# Patient Record
Sex: Male | Born: 1975 | Race: Black or African American | Hispanic: No | Marital: Married | Smoking: Current every day smoker
Health system: Southern US, Community
[De-identification: ages and names within clinical notes are randomized; demographics above are authoritative.]

## PROBLEM LIST (undated history)

## (undated) DIAGNOSIS — I10 Essential (primary) hypertension: Secondary | ICD-10-CM

## (undated) HISTORY — PX: KNEE SURGERY: SHX244

---

## 1998-11-29 ENCOUNTER — Emergency Department (HOSPITAL_COMMUNITY): Admission: EM | Admit: 1998-11-29 | Discharge: 1998-11-29 | Payer: Self-pay

## 1998-12-07 ENCOUNTER — Emergency Department (HOSPITAL_COMMUNITY): Admission: EM | Admit: 1998-12-07 | Discharge: 1998-12-07 | Payer: Self-pay | Admitting: Emergency Medicine

## 2008-10-30 ENCOUNTER — Emergency Department (HOSPITAL_COMMUNITY): Admission: EM | Admit: 2008-10-30 | Discharge: 2008-10-30 | Payer: Self-pay | Admitting: Emergency Medicine

## 2008-11-15 ENCOUNTER — Emergency Department (HOSPITAL_COMMUNITY): Admission: EM | Admit: 2008-11-15 | Discharge: 2008-11-15 | Payer: Self-pay | Admitting: Emergency Medicine

## 2010-07-21 LAB — URINALYSIS, MICROSCOPIC ONLY
Bilirubin Urine: NEGATIVE
Glucose, UA: NEGATIVE mg/dL
Nitrite: NEGATIVE
Protein, ur: 30 mg/dL — AB
Specific Gravity, Urine: 1.029 (ref 1.005–1.030)
Urobilinogen, UA: 0.2 mg/dL (ref 0.0–1.0)
pH: 7 (ref 5.0–8.0)

## 2010-07-21 LAB — BASIC METABOLIC PANEL
BUN: 11 mg/dL (ref 6–23)
CO2: 24 mEq/L (ref 19–32)
Calcium: 9.1 mg/dL (ref 8.4–10.5)
Chloride: 104 mEq/L (ref 96–112)
Creatinine, Ser: 1.37 mg/dL (ref 0.4–1.5)
GFR calc Af Amer: >60 mL/min (ref >60)
GFR calc non Af Amer: >60 mL/min (ref >60)
Glucose, Bld: 98 mg/dL (ref 70–99)
Potassium: 3.5 mEq/L (ref 3.5–5.1)
Sodium: 137 mEq/L (ref 135–145)

## 2010-10-29 ENCOUNTER — Emergency Department (HOSPITAL_COMMUNITY)
Admission: EM | Admit: 2010-10-29 | Discharge: 2010-10-29 | Disposition: A | Discharge status: Home / Self Care | Payer: Self-pay | Attending: Emergency Medicine | Admitting: Emergency Medicine

## 2010-10-29 DIAGNOSIS — N390 Urinary tract infection, site not specified: Secondary | ICD-10-CM | POA: Insufficient documentation

## 2010-10-29 DIAGNOSIS — E860 Dehydration: Secondary | ICD-10-CM | POA: Insufficient documentation

## 2010-10-29 DIAGNOSIS — R10819 Abdominal tenderness, unspecified site: Secondary | ICD-10-CM | POA: Insufficient documentation

## 2010-10-29 DIAGNOSIS — R197 Diarrhea, unspecified: Secondary | ICD-10-CM | POA: Insufficient documentation

## 2010-10-29 LAB — DIFFERENTIAL
Lymphs Abs: 1.4 10*3/uL (ref 0.7–4.0)
Monocytes Relative: 8 % (ref 3–12)
Neutro Abs: 6.6 10*3/uL (ref 1.7–7.7)
Neutrophils Relative %: 75 % (ref 43–77)

## 2010-10-29 LAB — URINE MICROSCOPIC-ADD ON

## 2010-10-29 LAB — COMPREHENSIVE METABOLIC PANEL
ALT: 15 U/L (ref 0–53)
CO2: 27 mEq/L (ref 19–32)
Calcium: 10 mg/dL (ref 8.4–10.5)
Creatinine, Ser: 1.42 mg/dL — ABNORMAL HIGH (ref 0.50–1.35)
GFR calc Af Amer: >60 mL/min (ref >60)
GFR calc non Af Amer: 57 mL/min — ABNORMAL LOW (ref >60)
Glucose, Bld: 91 mg/dL (ref 70–99)

## 2010-10-29 LAB — URINALYSIS, ROUTINE W REFLEX MICROSCOPIC
Glucose, UA: NEGATIVE mg/dL
Hgb urine dipstick: NEGATIVE
Specific Gravity, Urine: 1.036 — ABNORMAL HIGH (ref 1.005–1.030)
Urobilinogen, UA: 1 mg/dL (ref 0.0–1.0)

## 2010-10-29 LAB — PROTIME-INR
INR: 1.05 (ref 0.00–1.49)
Prothrombin Time: 13.9 seconds (ref 11.6–15.2)

## 2010-10-29 LAB — CBC
Hemoglobin: 16.1 g/dL (ref 13.0–17.0)
MCH: 31.8 pg (ref 26.0–34.0)
MCV: 90.1 fL (ref 78.0–100.0)
RBC: 5.07 MIL/uL (ref 4.22–5.81)

## 2010-10-31 LAB — URINE CULTURE: Colony Count: 45000

## 2011-09-27 ENCOUNTER — Emergency Department (HOSPITAL_COMMUNITY): Payer: Self-pay

## 2011-09-27 ENCOUNTER — Encounter (HOSPITAL_COMMUNITY): Payer: Self-pay | Admitting: Emergency Medicine

## 2011-09-27 DIAGNOSIS — N200 Calculus of kidney: Secondary | ICD-10-CM | POA: Insufficient documentation

## 2011-09-27 DIAGNOSIS — S31809A Unspecified open wound of unspecified buttock, initial encounter: Secondary | ICD-10-CM | POA: Insufficient documentation

## 2011-09-27 DIAGNOSIS — IMO0002 Reserved for concepts with insufficient information to code with codable children: Secondary | ICD-10-CM | POA: Insufficient documentation

## 2011-09-27 DIAGNOSIS — S62309A Unspecified fracture of unspecified metacarpal bone, initial encounter for closed fracture: Secondary | ICD-10-CM | POA: Insufficient documentation
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
## 2011-09-28 ENCOUNTER — Emergency Department (HOSPITAL_COMMUNITY): Payer: Self-pay

## 2011-09-28 ENCOUNTER — Emergency Department (HOSPITAL_COMMUNITY)
Admission: EM | Admit: 2011-09-28 | Discharge: 2011-09-28 | Disposition: A | Discharge status: Home / Self Care | Payer: Self-pay | Attending: Emergency Medicine | Admitting: Emergency Medicine

## 2011-09-28 DIAGNOSIS — S62308A Unspecified fracture of other metacarpal bone, initial encounter for closed fracture: Secondary | ICD-10-CM

## 2011-09-28 DIAGNOSIS — S8392XA Sprain of unspecified site of left knee, initial encounter: Secondary | ICD-10-CM

## 2011-09-28 DIAGNOSIS — S31801A Laceration without foreign body of unspecified buttock, initial encounter: Secondary | ICD-10-CM

## 2011-09-28 LAB — CBC
HCT: 47.8 % (ref 39.0–52.0)
Hemoglobin: 16.7 g/dL (ref 13.0–17.0)
MCH: 32.4 pg (ref 26.0–34.0)
MCHC: 34.9 g/dL (ref 30.0–36.0)
MCV: 92.6 fL (ref 78.0–100.0)

## 2011-09-28 LAB — DIFFERENTIAL
Basophils Relative: 0 % (ref 0–1)
Eosinophils Absolute: 0.3 10*3/uL (ref 0.0–0.7)
Eosinophils Relative: 2 % (ref 0–5)
Lymphs Abs: 2.3 10*3/uL (ref 0.7–4.0)
Monocytes Absolute: 0.8 10*3/uL (ref 0.1–1.0)
Monocytes Relative: 6 % (ref 3–12)

## 2011-09-28 LAB — BASIC METABOLIC PANEL
BUN: 10 mg/dL (ref 6–23)
Creatinine, Ser: 1.15 mg/dL (ref 0.50–1.35)
GFR calc Af Amer: >90 mL/min (ref >90)
GFR calc non Af Amer: 82 mL/min — ABNORMAL LOW (ref >90)
Glucose, Bld: 74 mg/dL (ref 70–99)

## 2011-09-28 MED ORDER — IOHEXOL 300 MG/ML  SOLN
100.0000 mL | Freq: Once | INTRAMUSCULAR | Status: AC | PRN
  Administered 2011-09-28: 100 mL via INTRAVENOUS

## 2011-09-28 MED ORDER — OXYCODONE-ACETAMINOPHEN 5-325 MG PO TABS: 1.0000 | ORAL_TABLET | ORAL | Status: DC | PRN

## 2011-09-28 MED ORDER — CEPHALEXIN 500 MG PO CAPS: 500.0000 mg | ORAL_CAPSULE | Freq: Four times a day (QID) | ORAL | Status: DC

## 2011-09-28 MED ORDER — FENTANYL CITRATE 0.05 MG/ML IJ SOLN
50.0000 ug | Freq: Once | INTRAMUSCULAR | Status: AC
  Administered 2011-09-28: 50 ug via INTRAVENOUS
  Filled 2011-09-28: qty 2

## 2011-09-28 MED ORDER — IBUPROFEN 600 MG PO TABS: 600.0000 mg | ORAL_TABLET | Freq: Four times a day (QID) | ORAL | Status: DC | PRN

## 2011-09-28 NOTE — ED Notes (Signed)
[employer] aware of pt's change in status,  Farley Ly charge aware and also noted
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
## 2011-09-28 NOTE — ED Notes (Signed)
Labs drawn by RN 

## 2011-09-28 NOTE — ED Provider Notes (Signed)
Medical screening examination/treatment/procedure(s) were performed by non-physician practitioner and as supervising physician I was immediately available for consultation/collaboration.   Ming Kunka, MD 09/28/11 0634 
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
## 2011-10-08 ENCOUNTER — Telehealth: Payer: Self-pay | Admitting: Orthopedic Surgery

## 2011-10-08 NOTE — Telephone Encounter (Signed)
Patient called for clinic appt. I had sent a message about a week ago to the PA who placed his staples recommending they see him back to take those out as we would have to charge him a new patient visit in order to do that. In any event, I cannot get him into clinic until 7/11, which is far too long for the staples to stay in. I left a message suggesting he call the ED and request they bring him in to remove his staples as that should be covered under his global period. He may call back if he has further questions.

## 2011-10-09 ENCOUNTER — Emergency Department (HOSPITAL_COMMUNITY)
Admission: EM | Admit: 2011-10-09 | Discharge: 2011-10-09 | Disposition: A | Discharge status: Home / Self Care | Payer: Self-pay | Attending: Emergency Medicine | Admitting: Emergency Medicine

## 2011-10-09 ENCOUNTER — Encounter (HOSPITAL_COMMUNITY): Payer: Self-pay | Admitting: Emergency Medicine

## 2011-10-09 DIAGNOSIS — Z4802 Encounter for removal of sutures: Secondary | ICD-10-CM | POA: Insufficient documentation

## 2011-10-09 NOTE — ED Provider Notes (Signed)
Medical screening examination/treatment/procedure(s) were performed by non-physician practitioner and as supervising physician I was immediately available for consultation/collaboration.   Kionte Baumgardner, MD 10/09/11 1557 
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
## 2011-10-09 NOTE — Discharge Instructions (Signed)
Staple Removal  Care After  The staples used to close your skin have been removed. The wound needs continued care so it can heal completely and without problems. The care described here will need to be done for another 5-10 days unless your caregiver advises otherwise.   HOME CARE INSTRUCTIONS    Keep wound site dry and clean.   If skin adhesive strips were applied after the staples were removed, they will begin to peel off in a few days. If they remain after fourteen days, they may be peeled off and discarded.   If you still have a dressing, change it at least once a day or as instructed by your caregiver. If the bandage sticks, soak it off with warm water. Pat dry with a clean towel. Look for signs of infection (see below).   Reapply cream or ointment according to your caregiver's instruction. This will help prevent infection and keep the bandage from sticking. Use of a non-stick material over the wound and under the dressing or wrap will also help keep the bandage from sticking.   If the bandage becomes wet, dirty or develops a foul smell, change it as soon as possible.   New scars become sunburned easily. Use sunscreens with protection factor (SPF) of at least 15 when out in the sun.   Only take over-the-counter or prescription medicines for pain, discomfort or fever as directed by your caregiver.  SEEK IMMEDIATE MEDICAL CARE IF:    There is redness, swelling or increasing pain in the wound.   Pus is coming from the wound.   An unexplained oral temperature above 102 F (38.9 C) develops.   You notice a foul smell coming from the wound or dressing.   There is a breaking open of the suture line (edges not staying together) of the wound edges after staples have been removed.  Document Released: 03/13/2008 Document Revised: 03/20/2011 Document Reviewed: 03/13/2008  [employer] Patient Information 2012 [employer].

## 2013-05-08 ENCOUNTER — Emergency Department (HOSPITAL_COMMUNITY)
Admission: EM | Admit: 2013-05-08 | Discharge: 2013-05-08 | Disposition: A | Discharge status: Home / Self Care | Payer: BC Managed Care – PPO | Attending: Emergency Medicine | Admitting: Emergency Medicine

## 2013-05-08 ENCOUNTER — Encounter (HOSPITAL_COMMUNITY): Payer: Self-pay | Admitting: Emergency Medicine

## 2013-05-08 DIAGNOSIS — IMO0002 Reserved for concepts with insufficient information to code with codable children: Secondary | ICD-10-CM | POA: Insufficient documentation

## 2013-05-08 DIAGNOSIS — M25462 Effusion, left knee: Secondary | ICD-10-CM

## 2013-05-08 DIAGNOSIS — X500XXA Overexertion from strenuous movement or load, initial encounter: Secondary | ICD-10-CM | POA: Insufficient documentation

## 2013-05-08 DIAGNOSIS — F17200 Nicotine dependence, unspecified, uncomplicated: Secondary | ICD-10-CM | POA: Insufficient documentation

## 2013-05-08 DIAGNOSIS — Y9389 Activity, other specified: Secondary | ICD-10-CM | POA: Insufficient documentation

## 2013-05-08 DIAGNOSIS — Y9289 Other specified places as the place of occurrence of the external cause: Secondary | ICD-10-CM | POA: Insufficient documentation

## 2013-05-08 DIAGNOSIS — S8390XA Sprain of unspecified site of unspecified knee, initial encounter: Secondary | ICD-10-CM

## 2013-05-08 MED ORDER — HYDROCODONE-ACETAMINOPHEN 5-325 MG PO TABS: 1.0000 | ORAL_TABLET | ORAL | Status: AC | PRN

## 2013-05-08 MED ORDER — IBUPROFEN 800 MG PO TABS
800.0000 mg | ORAL_TABLET | Freq: Once | ORAL | Status: AC
  Administered 2013-05-08: 800 mg via ORAL
  Filled 2013-05-08: qty 1

## 2013-05-08 MED ORDER — IBUPROFEN 800 MG PO TABS: 800.0000 mg | ORAL_TABLET | Freq: Three times a day (TID) | ORAL | Status: AC

## 2013-05-08 NOTE — ED Provider Notes (Signed)
CSN: [id number]     Arrival date & time 05/08/13  1910 History  This chart was scribed for non-physician practitioner Ivonne AndrewPeter Kiron Osmun, working with Junius ArgyleForrest S Harrison, MD by Carl Bestelina Holson, ED Scribe. This patient was seen in room WTR8/WTR8 and the patient's care was started at 8:50 PM.    Chief Complaint  Patient presents with  . Knee Injury    The history is provided by the patient. No language interpreter was used.   HPI Comments: Dale Velazquez is a 38 y.o. male who presents to the Emergency Department complaining of constant left knee pain that started yesterday after the patient was lifting furniture off of a truck and twisted his knee the wrong way.  He states that his knee went out toward the left.  He states that he can still walk using a cane.  He states that he has been using an ACE bandage to support his left knee.  He denies weakness or numbness in his left knee, fever, or chills as associated symptoms.  He denies using any medication to treat his pain.  He denies having a history of ligament repair in his left knee.  He states that he has a history of an ACL tear in his left knee.  He denies having any allergies to medication.  He denies having any other medical problems.     History reviewed. No pertinent past medical history. History reviewed. No pertinent past surgical history. No family history on file. History  Substance Use Topics  . Smoking status: Current Every Day Smoker -- 1.00 packs/day    Types: Cigarettes  . Smokeless tobacco: Not on file  . Alcohol Use: Yes    Review of Systems  Constitutional: Negative for fever and chills.  Musculoskeletal: Positive for arthralgias (left knee).  Neurological: Negative for numbness.  All other systems reviewed and are negative.    Allergies  Review of patient's allergies indicates no known allergies.  Home Medications  No current outpatient prescriptions on file.  Triage Vitals: BP 168/111  Pulse 80  Temp(Src) 99.1  F (37.3 C) (Oral)  Resp 20  Ht 5\' 9"  (1.753 m)  Wt 215 lb (97.523 kg)  BMI 31.74 kg/m2  SpO2 99%  Physical Exam  Nursing note and vitals reviewed. Constitutional: He is oriented to person, place, and time. He appears well-developed and well-nourished. No distress.  HENT:  Head: Normocephalic and atraumatic.  Eyes: EOM are normal.  Neck: Neck supple. No tracheal deviation present.  Cardiovascular: Normal rate.   Pulmonary/Chest: Effort normal. No respiratory distress.  Musculoskeletal: Normal range of motion.  Exam is somewhat limited due to patient comfort. Swelling and join effusion in the left knee.  Skin on the left knee is warm without erythema.    Neurological: He is alert and oriented to person, place, and time.  Skin: Skin is warm and dry.  Psychiatric: He has a normal mood and affect. His behavior is normal.    ED Course  Procedures   DIAGNOSTIC STUDIES: Oxygen Saturation is 99% on room air, normal by my interpretation.    COORDINATION OF CARE: 8:53 PM- Advised the patient to apply ice to, wrap, and elevate his left knee. Discussed options for x-rays of the knee with patient. He has refused and prefers to followup with an orthopedic specialist. There was no history of significant trauma. Advised the patient to follow-up with an orthopedic doctor.  Advised the patient to take Ibuprofen or Aleve everyday every 12 hours.  Discussed  discharging the patient with crutches and a prescription for Ibuprofen.  The patient agreed to the treatment plan.     MDM   1. Knee effusion, left   2. Knee sprain       I personally performed the services described in this documentation, which was scribed in my presence. The recorded information has been reviewed and is accurate.   Angus Seller, PA-C 05/08/13 2251

## 2013-05-08 NOTE — ED Notes (Signed)
Will need a note for work. Not a work related injury

## 2013-05-08 NOTE — ED Notes (Signed)
Pt presents with NAD- Pt moving furniture and twisted left knee yesterday. Pt c/o of swelling and pain. Pt has tried nothing OTC for pain. ACE bandage intact Good CMS

## 2013-05-08 NOTE — ED Provider Notes (Signed)
Medical screening examination/treatment/procedure(s) were performed by non-physician practitioner and as supervising physician I was immediately available for consultation/collaboration.  EKG Interpretation   None         Junius ArgyleForrest S Deriana Vanderhoef, MD 05/08/13 2329

## 2013-05-08 NOTE — Discharge Instructions (Signed)
Please followup with a primary care provider or orthopedics specialist for continued evaluation and treatment of your knee pain and injury. Use rest, ice, compression and elevation to reduce pain and swelling.    Knee Effusion The medical term for having fluid in your knee is effusion. This is often due to an internal derangement of the knee. This means something is wrong inside the knee. Some of the causes of fluid in the knee may be torn cartilage, a torn ligament, or bleeding into the joint from an injury. Your knee is likely more difficult to bend and move. This is often because there is increased pain and pressure in the joint. The time it takes for recovery from a knee effusion depends on different factors, including:   Type of injury.  Your age.  Physical and medical conditions.  Rehabilitation Strategies. How long you will be away from your normal activities will depend on what kind of knee problem you have and how much damage is present. Your knee has two types of cartilage. Articular cartilage covers the bone ends and lets your knee bend and move smoothly. Two menisci, thick pads of cartilage that form a rim inside the joint, help absorb shock and stabilize your knee. Ligaments bind the bones together and support your knee joint. Muscles move the joint, help support your knee, and take stress off the joint itself. CAUSES  Often an effusion in the knee is caused by an injury to one of the menisci. This is often a tear in the cartilage. Recovery after a meniscus injury depends on how much meniscus is damaged and whether you have damaged other knee tissue. Small tears may heal on their own with conservative treatment. Conservative means rest, limited weight bearing activity and muscle strengthening exercises. Your recovery may take up to 6 weeks.  TREATMENT  Larger tears may require surgery. Meniscus injuries may be treated during arthroscopy. Arthroscopy is a procedure in which your surgeon  uses a small telescope like instrument to look in your knee. Your caregiver can make a more accurate diagnosis (learning what is wrong) by performing an arthroscopic procedure. If your injury is on the inner margin of the meniscus, your surgeon may trim the meniscus back to a smooth rim. In other cases your surgeon will try to repair a damaged meniscus with stitches (sutures). This may make rehabilitation take longer, but may provide better long term result by helping your knee keep its shock absorption capabilities. Ligaments which are completely torn usually require surgery for repair. HOME CARE INSTRUCTIONS  Use crutches as instructed.  If a brace is applied, use as directed.  Once you are home, an ice pack applied to your swollen knee may help with discomfort and help decrease swelling.  Keep your knee raised (elevated) when you are not up and around or on crutches.  Only take over-the-counter or prescription medicines for pain, discomfort, or fever as directed by your caregiver.  Your caregivers will help with instructions for rehabilitation of your knee. This often includes strengthening exercises.  You may resume a normal diet and activities as directed. SEEK MEDICAL CARE IF:   There is increased swelling in your knee.  You notice redness, swelling, or increasing pain in your knee.  An unexplained oral temperature above 102 F (38.9 C) develops. SEEK IMMEDIATE MEDICAL CARE IF:   You develop a rash.  You have difficulty breathing.  You have any allergic reactions from medications you may have been given.  There is severe  pain with any motion of the knee. MAKE SURE YOU:   Understand these instructions.  Will watch your condition.  Will get help right away if you are not doing well or get worse. Document Released: 06/21/2003 Document Revised: 06/23/2011 Document Reviewed: 08/25/2007 [facility] Patient Information 2014 [place]Care, [place].

## 2014-04-07 ENCOUNTER — Emergency Department (HOSPITAL_COMMUNITY)
Admission: EM | Admit: 2014-04-07 | Discharge: 2014-04-07 | Disposition: A | Discharge status: Home / Self Care | Payer: BC Managed Care – PPO | Attending: Emergency Medicine | Admitting: Emergency Medicine

## 2014-04-07 ENCOUNTER — Encounter (HOSPITAL_COMMUNITY): Payer: Self-pay

## 2014-04-07 ENCOUNTER — Emergency Department (HOSPITAL_COMMUNITY): Payer: BC Managed Care – PPO

## 2014-04-07 DIAGNOSIS — S2231XA Fracture of one rib, right side, initial encounter for closed fracture: Secondary | ICD-10-CM

## 2014-04-07 DIAGNOSIS — S028XXA Fractures of other specified skull and facial bones, initial encounter for closed fracture: Secondary | ICD-10-CM | POA: Insufficient documentation

## 2014-04-07 DIAGNOSIS — Z720 Tobacco use: Secondary | ICD-10-CM | POA: Insufficient documentation

## 2014-04-07 DIAGNOSIS — Y9289 Other specified places as the place of occurrence of the external cause: Secondary | ICD-10-CM | POA: Insufficient documentation

## 2014-04-07 DIAGNOSIS — I10 Essential (primary) hypertension: Secondary | ICD-10-CM | POA: Insufficient documentation

## 2014-04-07 DIAGNOSIS — Y998 Other external cause status: Secondary | ICD-10-CM | POA: Insufficient documentation

## 2014-04-07 DIAGNOSIS — R000 Tachycardia, unspecified: Secondary | ICD-10-CM | POA: Insufficient documentation

## 2014-04-07 DIAGNOSIS — S022XXA Fracture of nasal bones, initial encounter for closed fracture: Secondary | ICD-10-CM | POA: Insufficient documentation

## 2014-04-07 DIAGNOSIS — S02839A Fracture of medial orbital wall, unspecified side, initial encounter for closed fracture: Secondary | ICD-10-CM

## 2014-04-07 DIAGNOSIS — Z791 Long term (current) use of non-steroidal anti-inflammatories (NSAID): Secondary | ICD-10-CM | POA: Insufficient documentation

## 2014-04-07 DIAGNOSIS — Y9389 Activity, other specified: Secondary | ICD-10-CM | POA: Insufficient documentation

## 2014-04-07 DIAGNOSIS — S023XXA Fracture of orbital floor, initial encounter for closed fracture: Secondary | ICD-10-CM | POA: Insufficient documentation

## 2014-04-07 HISTORY — DX: Essential (primary) hypertension: I10

## 2014-04-07 MED ORDER — CIPROFLOXACIN HCL 500 MG PO TABS: 500.0000 mg | ORAL_TABLET | Freq: Two times a day (BID) | ORAL | Status: AC

## 2014-04-07 MED ORDER — PREDNISONE 10 MG PO TABS: ORAL_TABLET | ORAL | Status: AC

## 2014-04-07 MED ORDER — PREDNISONE 10 MG PO TABS: ORAL_TABLET | ORAL | Status: DC

## 2014-04-07 MED ORDER — TOBRAMYCIN-DEXAMETHASONE 0.3-0.1 % OP OINT
TOPICAL_OINTMENT | OPHTHALMIC | Status: DC
Start: 2014-04-07 — End: 2014-04-07

## 2014-04-07 MED ORDER — CIPROFLOXACIN HCL 500 MG PO TABS: 500.0000 mg | ORAL_TABLET | Freq: Two times a day (BID) | ORAL | Status: DC

## 2014-04-07 MED ORDER — TOBRAMYCIN-DEXAMETHASONE 0.3-0.1 % OP OINT
TOPICAL_OINTMENT | Freq: Two times a day (BID) | OPHTHALMIC | Status: DC
  Administered 2014-04-07: 13:00:00 via OPHTHALMIC
  Filled 2014-04-07: qty 3.5

## 2014-04-07 MED ORDER — OXYCODONE-ACETAMINOPHEN 5-325 MG PO TABS: 1.0000 | ORAL_TABLET | Freq: Four times a day (QID) | ORAL | Status: AC | PRN

## 2014-04-07 MED ORDER — OXYCODONE-ACETAMINOPHEN 5-325 MG PO TABS: 1.0000 | ORAL_TABLET | Freq: Four times a day (QID) | ORAL | Status: DC | PRN

## 2014-04-07 NOTE — ED Notes (Signed)
Pt. Taught how to use incentive spirometer. Understanding demonstrated by use and teach back. Reached 750.

## 2014-04-07 NOTE — ED Notes (Signed)
Patient transported to X-ray 

## 2014-04-07 NOTE — Discharge Instructions (Signed)
Take ciprofloxacin twice daily for 1 week as prescribed. Take prednisone taper for the next 5 days. Apply TobraDex eye ointment twice daily, apply a quarter-inch strip to your left eye until follow-up with the ophthalmologist. Follow-up with the ophthalmologist Monday, December 28. Call as soon as possible to confirm this appointment. Take percocet for severe pain only. No driving or operating heavy machinery while taking percocet. This medication may cause drowsiness. Use incentive spirometer as directed.  Nasal Fracture A fracture is a break in the bone. A nasal fracture is a broken nose. Minor breaks do not need treatment. Serious breaks may need surgery.  HOME CARE  Put ice on the injured area.  Put ice in a plastic bag.  Place a towel between your skin and the bag.  Leave the ice on for 15-20 minutes, 03-04 times a day.  Only take medicine as told by your doctor.  If your nose bleeds, squeeze your nose shut gently. Sit upright for 10 minutes.  Do not play contact sports for 3 to 4 weeks or as told by your doctor. GET HELP RIGHT AWAY IF:   You have more pain or severe pain.  You keep having nosebleeds.  The shape of your nose does not return to normal after 5 days.  You have yellowish white fluid (pus) coming from your nose.  Your nose bleeds for over 20 minutes.  Clear fluid drains from your nose.  You have a grape-like puffiness (swelling) on the inside of your nose.  You have trouble moving your eyes.  You keep throwing up (vomiting). MAKE SURE YOU:   Understand these instructions.  Will watch this condition.  Will get help right away if you are not doing well or get worse. Document Released: 01/08/2008 Document Revised: 06/23/2011 Document Reviewed: 07/15/2010 [facility] Patient Information 2015 [place], [place]. This information is not intended to replace advice given to you by your health care provider. Make sure you discuss any questions you have with your  health care provider.  Head Injury You have received a head injury. It does not appear serious at this time. Headaches and vomiting are common following head injury. It should be easy to awaken from sleeping. Sometimes it is necessary for you to stay in the emergency department for a while for observation. Sometimes admission to the hospital may be needed. After injuries such as yours, most problems occur within the first 24 hours, but side effects may occur up to 7-10 days after the injury. It is important for you to carefully monitor your condition and contact your health care provider or seek immediate medical care if there is a change in your condition. WHAT ARE THE TYPES OF HEAD INJURIES? Head injuries can be as minor as a bump. Some head injuries can be more severe. More severe head injuries include:  A jarring injury to the brain (concussion).  A bruise of the brain (contusion). This mean there is bleeding in the brain that can cause swelling.  A cracked skull (skull fracture).  Bleeding in the brain that collects, clots, and forms a bump (hematoma). WHAT CAUSES A HEAD INJURY? A serious head injury is most likely to happen to someone who is in a car wreck and is not wearing a seat belt. Other causes of major head injuries include bicycle or motorcycle accidents, sports injuries, and falls. HOW ARE HEAD INJURIES DIAGNOSED? A complete history of the event leading to the injury and your current symptoms will be helpful in diagnosing head injuries.  Many times, pictures of the brain, such as CT or MRI are needed to see the extent of the injury. Often, an overnight hospital stay is necessary for observation.  WHEN SHOULD I SEEK IMMEDIATE MEDICAL CARE?  You should get help right away if:  You have confusion or drowsiness.  You feel sick to your stomach (nauseous) or have continued, forceful vomiting.  You have dizziness or unsteadiness that is getting worse.  You have severe, continued  headaches not relieved by medicine. Only take over-the-counter or prescription medicines for pain, fever, or discomfort as directed by your health care provider.  You do not have normal function of the arms or legs or are unable to walk.  You notice changes in the black spots in the center of the colored part of your eye (pupil).  You have a clear or bloody fluid coming from your nose or ears.  You have a loss of vision. During the next 24 hours after the injury, you must stay with someone who can watch you for the warning signs. This person should contact local emergency services (911 in the [place] if you have seizures, you become unconscious, or you are unable to wake up. HOW CAN I PREVENT A HEAD INJURY IN THE FUTURE? The most important factor for preventing major head injuries is avoiding motor vehicle accidents. To minimize the potential for damage to your head, it is crucial to wear seat belts while riding in motor vehicles. Wearing helmets while bike riding and playing collision sports (like football) is also helpful. Also, avoiding dangerous activities around the house will further help reduce your risk of head injury.  WHEN CAN I RETURN TO NORMAL ACTIVITIES AND ATHLETICS? You should be reevaluated by your health care provider before returning to these activities. If you have any of the following symptoms, you should not return to activities or contact sports until 1 week after the symptoms have stopped:  Persistent headache.  Dizziness or vertigo.  Poor attention and concentration.  Confusion.  Memory problems.  Nausea or vomiting.  Fatigue or tire easily.  Irritability.  Intolerant of bright lights or loud noises.  Anxiety or depression.  Disturbed sleep. MAKE SURE YOU:   Understand these instructions.  Will watch your condition.  Will get help right away if you are not doing well or get worse. Document Released: 03/31/2005 Document Revised: 04/05/2013 Document  Reviewed: 12/06/2012 [facility] Patient Information 2015 [place], [place]. This information is not intended to replace advice given to you by your health care provider. Make sure you discuss any questions you have with your health care provider.  Orbital Floor Fracture, Non-Blowout The eye sits in the part of the skull called the "orbit." The upper and outside walls of the orbit are thick and strong. The inside wall (near the nose) and the orbital floor are very thin and weak. The tissues around the eye will briefly press together if there is a direct blow to the front of the eye. This leads to high pressure against the orbital walls. The inside wall and the orbital floor may break since these are the weakest walls. If the orbital floor breaks, the tissues around the eye, including the muscle that makes the eye look down, may become trapped in the sinus below when the orbital floor "blows out." If a blowout does not happen, the orbital floor fracture is considered a non-blowout orbital fracture. CAUSES An orbital floor fracture can be caused by any accident in which an object hits the  face or the face strikes against a hard object. The most common ways that people break their eye socket include:  Being hit by a blunt object, such as a baseball bat or a fist.  Striking the face on the car dashboard during a crash.  Falls.  Gunshot. SYMPTOMS  If there has been no injury to the eye itself, symptoms may include:  Puffiness (swelling) and bruising around the eye area (black eye).  Numbness of the cheek and upper gum on the side with the floor fracture. This is caused by nerve injury to these areas.  Pain around the eye.  Headache.  Ear pain on the injured side. DIAGNOSIS The diagnosis of an orbital floor fracture is suspected during an eye exam by an ophthalmologist. It is confirmed by X-rays or CT scan. TREATMENT Your caregiver may suggest waiting 1 or 2 weeks for the swelling to go away  before examining the eye. When the swelling lessens, your caregiver will examine the eye to see if there is any sign of a trapped muscle or double vision when looking in different directions. If double vision is not found and muscle or tissue did not get trapped, no further treatment is necessary. After that, in almost all cases, the bones heal together on their own.  HOME CARE INSTRUCTIONS  Take all pain medicine as directed by your caregiver.  Use ice packs or other cold therapy to reduce swelling as directed by your caregiver.  Do not put a contact lens in the injured eye until your caregiver approves.  Avoid dusty environments.  Always wear protective glasses or goggles when recommended. Wearing protective eyewear is not dangerous to your injured eye and will not delay healing.  As long as your other eye is seeing normally, you may return to work and drive.  You may travel by plane or be in high altitudes. However, your swelling may take longer to go away, and you may have sinus pain.  Be aware that your depth perception and your ability to judge distance may be reduced or lost. SEEK IMMEDIATE MEDICAL CARE IF:  Your vision changes.  Your redness or swelling persists around the injured eye or gets worse.  You start to have double vision.  You have a bloody or discolored discharge from your nose.  You have a fever that lasts longer than 2 to 3 days.  You have a fever that suddenly gets worse.  Your cheek or upper gum numbness does not go away. MAKE SURE YOU:  Understand these instructions.  Will watch your condition.  Will get help right away if you are not doing well or get worse. Document Released: 06/23/2011 Document Revised: 08/15/2013 Document Reviewed: 06/23/2011 [facility] Patient Information 2015 [place], [place]. This information is not intended to replace advice given to you by your health care provider. Make sure you discuss any questions you have with your health  care provider.  Rib Fracture A rib fracture is a break or crack in one of the bones of the ribs. The ribs are a group of long, curved bones that wrap around your chest and attach to your spine. They protect your lungs and other organs in the chest cavity. A broken or cracked rib is often painful, but most do not cause other problems. Most rib fractures heal on their own over time. However, rib fractures can be more serious if multiple ribs are broken or if broken ribs move out of place and push against other structures. CAUSES  A direct blow to the chest. For example, this could happen during contact sports, a car accident, or a fall against a hard object.  Repetitive movements with high force, such as pitching a baseball or having severe coughing spells. SYMPTOMS   Pain when you breathe in or cough.  Pain when someone presses on the injured area. DIAGNOSIS  Your caregiver will perform a physical exam. Various imaging tests may be ordered to confirm the diagnosis and to look for related injuries. These tests may include a chest X-ray, computed tomography (CT), magnetic resonance imaging (MRI), or a bone scan. TREATMENT  Rib fractures usually heal on their own in 1-3 months. The longer healing period is often associated with a continued cough or other aggravating activities. During the healing period, pain control is very important. Medication is usually given to control pain. Hospitalization or surgery may be needed for more severe injuries, such as those in which multiple ribs are broken or the ribs have moved out of place.  HOME CARE INSTRUCTIONS   Avoid strenuous activity and any activities or movements that cause pain. Be careful during activities and avoid bumping the injured rib.  Gradually increase activity as directed by your caregiver.  Only take over-the-counter or prescription medications as directed by your caregiver. Do not take other medications without asking your caregiver  first.  Apply ice to the injured area for the first 1-2 days after you have been treated or as directed by your caregiver. Applying ice helps to reduce inflammation and pain.  Put ice in a plastic bag.  Place a towel between your skin and the bag.   Leave the ice on for 15-20 minutes at a time, every 2 hours while you are awake.  Perform deep breathing as directed by your caregiver. This will help prevent pneumonia, which is a common complication of a broken rib. Your caregiver may instruct you to:  Take deep breaths several times a day.  Try to cough several times a day, holding a pillow against the injured area.  Use a device called an incentive spirometer to practice deep breathing several times a day.  Drink enough fluids to keep your urine clear or pale yellow. This will help you avoid constipation.   Do not wear a rib belt or binder. These restrict breathing, which can lead to pneumonia.  SEEK IMMEDIATE MEDICAL CARE IF:   You have a fever.   You have difficulty breathing or shortness of breath.   You develop a continual cough, or you cough up thick or bloody sputum.  You feel sick to your stomach (nausea), throw up (vomit), or have abdominal pain.   You have worsening pain not controlled with medications.  MAKE SURE YOU:  Understand these instructions.  Will watch your condition.  Will get help right away if you are not doing well or get worse. Document Released: 03/31/2005 Document Revised: 12/01/2012 Document Reviewed: 06/02/2012 [facility] Patient Information 2015 [place]Exit[place], [place]. This information is not intended to replace advice given to you by your health care provider. Make sure you discuss any questions you have with your health care provider.  Facial Fracture A facial fracture is a break in one of the bones of your face. HOME CARE INSTRUCTIONS   Protect the injured part of your face until it is healed.  Do not participate in activities which give  chance for re-injury until your doctor approves.  Gently wash and dry your face.  Wear head and facial protection while riding  a bicycle, motorcycle, or snowmobile. SEEK MEDICAL CARE IF:   An oral temperature above 102 F (38.9 C) develops.  You have severe headaches or notice changes in your vision.  You have new numbness or tingling in your face.  You develop nausea (feeling sick to your stomach), vomiting or a stiff neck. SEEK IMMEDIATE MEDICAL CARE IF:   You develop difficulty seeing or experience double vision.  You become dizzy, lightheaded, or faint.  You develop trouble speaking, breathing, or swallowing.  You have a watery discharge from your nose or ear. MAKE SURE YOU:   Understand these instructions.  Will watch your condition.  Will get help right away if you are not doing well or get worse. Document Released: 03/31/2005 Document Revised: 06/23/2011 Document Reviewed: 11/18/2007 [facility] Patient Information 2015 [place], [place]. This information is not intended to replace advice given to you by your health care provider. Make sure you discuss any questions you have with your health care provider.
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
## 2014-06-12 ENCOUNTER — Emergency Department (HOSPITAL_COMMUNITY)
Admission: EM | Admit: 2014-06-12 | Discharge: 2014-06-12 | Disposition: A | Discharge status: Home / Self Care | Payer: PRIVATE HEALTH INSURANCE | Attending: Emergency Medicine | Admitting: Emergency Medicine

## 2014-06-12 ENCOUNTER — Encounter (HOSPITAL_COMMUNITY): Payer: Self-pay | Admitting: Emergency Medicine

## 2014-06-12 DIAGNOSIS — Z79899 Other long term (current) drug therapy: Secondary | ICD-10-CM | POA: Diagnosis not present

## 2014-06-12 DIAGNOSIS — Z792 Long term (current) use of antibiotics: Secondary | ICD-10-CM | POA: Diagnosis not present

## 2014-06-12 DIAGNOSIS — I10 Essential (primary) hypertension: Secondary | ICD-10-CM | POA: Diagnosis not present

## 2014-06-12 DIAGNOSIS — Z720 Tobacco use: Secondary | ICD-10-CM | POA: Insufficient documentation

## 2014-06-12 DIAGNOSIS — J069 Acute upper respiratory infection, unspecified: Secondary | ICD-10-CM | POA: Diagnosis not present

## 2014-06-12 DIAGNOSIS — Z7952 Long term (current) use of systemic steroids: Secondary | ICD-10-CM | POA: Insufficient documentation

## 2014-06-12 DIAGNOSIS — R0981 Nasal congestion: Secondary | ICD-10-CM | POA: Diagnosis present

## 2014-06-12 MED ORDER — CETIRIZINE-PSEUDOEPHEDRINE ER 5-120 MG PO TB12
1.0000 | ORAL_TABLET | Freq: Two times a day (BID) | ORAL | Status: AC
Start: 2014-06-12 — End: ?

## 2014-06-12 NOTE — ED Provider Notes (Signed)
CSN: [id number]     Arrival date & time 06/12/14  2234 History  This chart was scribed for Elpidio AnisShari Fannye Myer, PA, working with Lyanne CoKevin M Campos, MD by Elon SpannerGarrett Cook, ED Scribe. This patient was seen in room WTR8/WTR8 and the patient's care was started at 11:16 PM.   No chief complaint on file.  The history is provided by the patient. No language interpreter was used.   HPI Comments: Dale Velazquez is a 39 y.o. male who presents to the Emergency Department complaining of nasal/chest congestion productive of green phlegm with associated sore throat, sneezing, cough, generalized body aches, and onset yesterday morning.  Patient has taken Nyquil.  He reports his daughter has had a sore throat recently.  Patient denies fever, nausea, vomiting.  NKA.  Past Medical History  Diagnosis Date  . Hypertension    Past Surgical History  Procedure Laterality Date  . Knee surgery     No family history on file. History  Substance Use Topics  . Smoking status: Current Every Day Smoker -- 1.00 packs/day    Types: Cigarettes  . Smokeless tobacco: Not on file  . Alcohol Use: Yes    Review of Systems  Constitutional: Negative for fever.  HENT: Positive for congestion, sneezing and sore throat.   Respiratory: Positive for cough.   Gastrointestinal: Negative for nausea and vomiting.      Allergies  Review of patient's allergies indicates no known allergies.  Home Medications   Prior to Admission medications   Medication Sig Start Date End Date Taking? Authorizing Provider  ciprofloxacin (CIPRO) 500 MG tablet Take 1 tablet (500 mg total) by mouth 2 (two) times daily. One po bid x 7 days 04/07/14   Kathrynn Speedobyn M Hess, PA-C  HYDROcodone-acetaminophen (NORCO/VICODIN) 5-325 MG per tablet Take 1 tablet by mouth every 4 (four) hours as needed for moderate pain. Patient not taking: Reported on 04/07/2014 05/08/13   Phill MutterPeter S Dammen, PA-C  ibuprofen (ADVIL,MOTRIN) 800 MG tablet Take 1 tablet (800 mg total) by mouth 3  (three) times daily. 05/08/13   Angus SellerPeter S Dammen, PA-C  oxyCODONE-acetaminophen (PERCOCET) 5-325 MG per tablet Take 1-2 tablets by mouth every 6 (six) hours as needed for severe pain. 04/07/14   Kathrynn Speedobyn M Hess, PA-C  predniSONE (DELTASONE) 10 MG tablet 5 tabs PO x 1 day, 4 tabs PO x 1 day, 3 tabs PO x 1 day, 2 tabs PO x 1 day, 1 tab PO x 1 day 04/07/14   Kathrynn Speedobyn M Hess, PA-C   There were no vitals taken for this visit. Physical Exam  Constitutional: He is oriented to person, place, and time. He appears well-developed and well-nourished. No distress.  HENT:  Head: Normocephalic and atraumatic.  Mouth/Throat: Oropharynx is clear and moist.  Nasal mucosa normal.  TM's normal.    Eyes: Conjunctivae and EOM are normal.  Neck: Neck supple. No tracheal deviation present.  Cardiovascular: Normal rate.   Pulmonary/Chest: Effort normal. No respiratory distress.  Musculoskeletal: Normal range of motion.  Neurological: He is alert and oriented to person, place, and time.  Skin: Skin is warm and dry.  Psychiatric: He has a normal mood and affect. His behavior is normal.  Nursing note and vitals reviewed.   ED Course  Procedures (including critical care time)  DIAGNOSTIC STUDIES: Oxygen Saturation is 100% on RA, normal by my interpretation.    COORDINATION OF CARE:  11:18 PM Discussed treatment plan with patient at bedside.  Patient acknowledges and agrees with plan.  Labs Review Labs Reviewed - No data to display  Imaging Review No results found.   EKG Interpretation None      MDM   Final diagnoses:  None    1. URI  Uncomplicated URI requiring supportive care.  I personally performed the services described in this documentation, which was scribed in my presence. The recorded information has been reviewed and is accurate.     Elpidio Anis, PA-C 06/19/14 1705  Azalia Bilis, MD 06/21/14 [phone]

## 2014-06-12 NOTE — ED Notes (Signed)
Pt ambulating independently w/ steady gait on d/c in no acute distress, A&Ox4. D/c instructions reviewed w/ pt and family - pt and family deny any further questions or concerns at present. Rx given x1  

## 2014-06-12 NOTE — ED Notes (Signed)
Pt states that he started having nasal and chest congestion today. Green sputum. Hypertensive w/ a hx of same. Alert and oriented.

## 2014-06-12 NOTE — Discharge Instructions (Signed)
Upper Respiratory Infection, Adult An upper respiratory infection (URI) is also sometimes known as the common cold. The upper respiratory tract includes the nose, sinuses, throat, trachea, and bronchi. Bronchi are the airways leading to the lungs. Most people improve within 1 week, but symptoms can last up to 2 weeks. A residual cough may last even longer.  CAUSES Many different viruses can infect the tissues lining the upper respiratory tract. The tissues become irritated and inflamed and often become very moist. Mucus production is also common. A cold is contagious. You can easily spread the virus to others by oral contact. This includes kissing, sharing a glass, coughing, or sneezing. Touching your mouth or nose and then touching a surface, which is then touched by another person, can also spread the virus. SYMPTOMS  Symptoms typically develop 1 to 3 days after you come in contact with a cold virus. Symptoms vary from person to person. They may include:  Runny nose.  Sneezing.  Nasal congestion.  Sinus irritation.  Sore throat.  Loss of voice (laryngitis).  Cough.  Fatigue.  Muscle aches.  Loss of appetite.  Headache.  Low-grade fever. DIAGNOSIS  You might diagnose your own cold based on familiar symptoms, since most people get a cold 2 to 3 times a year. Your caregiver can confirm this based on your exam. Most importantly, your caregiver can check that your symptoms are not due to another disease such as strep throat, sinusitis, pneumonia, asthma, or epiglottitis. Blood tests, throat tests, and X-rays are not necessary to diagnose a common cold, but they may sometimes be helpful in excluding other more serious diseases. Your caregiver will decide if any further tests are required. RISKS AND COMPLICATIONS  You may be at risk for a more severe case of the common cold if you smoke cigarettes, have chronic heart disease (such as heart failure) or lung disease (such as asthma), or if  you have a weakened immune system. The very young and very old are also at risk for more serious infections. Bacterial sinusitis, middle ear infections, and bacterial pneumonia can complicate the common cold. The common cold can worsen asthma and chronic obstructive pulmonary disease (COPD). Sometimes, these complications can require emergency medical care and may be life-threatening. PREVENTION  The best way to protect against getting a cold is to practice good hygiene. Avoid oral or hand contact with people with cold symptoms. Wash your hands often if contact occurs. There is no clear evidence that vitamin C, vitamin E, echinacea, or exercise reduces the chance of developing a cold. However, it is always recommended to get plenty of rest and practice good nutrition. TREATMENT  Treatment is directed at relieving symptoms. There is no cure. Antibiotics are not effective, because the infection is caused by a virus, not by bacteria. Treatment may include:  Increased fluid intake. Sports drinks offer valuable electrolytes, sugars, and fluids.  Breathing heated mist or steam (vaporizer or shower).  Eating chicken soup or other clear broths, and maintaining good nutrition.  Getting plenty of rest.  Using gargles or lozenges for comfort.  Controlling fevers with ibuprofen or acetaminophen as directed by your caregiver.  Increasing usage of your inhaler if you have asthma. Zinc gel and zinc lozenges, taken in the first 24 hours of the common cold, can shorten the duration and lessen the severity of symptoms. Pain medicines may help with fever, muscle aches, and throat pain. A variety of non-prescription medicines are available to treat congestion and runny nose. Your caregiver   can make recommendations and may suggest nasal or lung inhalers for other symptoms.  HOME CARE INSTRUCTIONS   Only take over-the-counter or prescription medicines for pain, discomfort, or fever as directed by your  caregiver.  Use a warm mist humidifier or inhale steam from a shower to increase air moisture. This may keep secretions moist and make it easier to breathe.  Drink enough water and fluids to keep your urine clear or pale yellow.  Rest as needed.  Return to work when your temperature has returned to normal or as your caregiver advises. You may need to stay home longer to avoid infecting others. You can also use a face mask and careful hand washing to prevent spread of the virus. SEEK MEDICAL CARE IF:   After the first few days, you feel you are getting worse rather than better.  You need your caregiver's advice about medicines to control symptoms.  You develop chills, worsening shortness of breath, or brown or red sputum. These may be signs of pneumonia.  You develop yellow or brown nasal discharge or pain in the face, especially when you bend forward. These may be signs of sinusitis.  You develop a fever, swollen neck glands, pain with swallowing, or white areas in the back of your throat. These may be signs of strep throat. SEEK IMMEDIATE MEDICAL CARE IF:   You have a fever.  You develop severe or persistent headache, ear pain, sinus pain, or chest pain.  You develop wheezing, a prolonged cough, cough up blood, or have a change in your usual mucus (if you have chronic lung disease).  You develop sore muscles or a stiff neck. Document Released: 09/24/2000 Document Revised: 06/23/2011 Document Reviewed: 07/06/2013 [employer] Patient Information 2015 [employer]. This information is not intended to replace advice given to you by your health care provider. Make sure you discuss any questions you have with your health care provider.  
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
